# Patient Record
Sex: Female | Born: 1955 | Race: Black or African American | Hispanic: No | Marital: Single | Smoking: Current every day smoker
Health system: Southern US, Community
[De-identification: ages and names within clinical notes are randomized; demographics above are authoritative.]

## PROBLEM LIST (undated history)

## (undated) DIAGNOSIS — D66 Hereditary factor VIII deficiency: Secondary | ICD-10-CM

## (undated) DIAGNOSIS — I82409 Acute embolism and thrombosis of unspecified deep veins of unspecified lower extremity: Secondary | ICD-10-CM

## (undated) DIAGNOSIS — I10 Essential (primary) hypertension: Secondary | ICD-10-CM

## (undated) DIAGNOSIS — I2510 Atherosclerotic heart disease of native coronary artery without angina pectoris: Secondary | ICD-10-CM

## (undated) DIAGNOSIS — E1169 Type 2 diabetes mellitus with other specified complication: Secondary | ICD-10-CM

## (undated) DIAGNOSIS — E669 Obesity, unspecified: Secondary | ICD-10-CM

## (undated) DIAGNOSIS — I509 Heart failure, unspecified: Secondary | ICD-10-CM

## (undated) HISTORY — PX: CHOLECYSTECTOMY: SHX55

---

## 2015-10-11 ENCOUNTER — Encounter (HOSPITAL_COMMUNITY): Payer: Self-pay | Admitting: Internal Medicine

## 2015-10-11 ENCOUNTER — Emergency Department (HOSPITAL_COMMUNITY): Payer: Medicare Other

## 2015-10-11 ENCOUNTER — Emergency Department (HOSPITAL_COMMUNITY)
Admission: EM | Admit: 2015-10-11 | Discharge: 2015-10-28 | Disposition: E | Payer: Medicare Other | Attending: Emergency Medicine | Admitting: Emergency Medicine

## 2015-10-11 DIAGNOSIS — E876 Hypokalemia: Secondary | ICD-10-CM | POA: Diagnosis not present

## 2015-10-11 DIAGNOSIS — Z880 Allergy status to penicillin: Secondary | ICD-10-CM | POA: Diagnosis not present

## 2015-10-11 DIAGNOSIS — D709 Neutropenia, unspecified: Secondary | ICD-10-CM | POA: Diagnosis not present

## 2015-10-11 DIAGNOSIS — I2510 Atherosclerotic heart disease of native coronary artery without angina pectoris: Secondary | ICD-10-CM | POA: Insufficient documentation

## 2015-10-11 DIAGNOSIS — M7989 Other specified soft tissue disorders: Secondary | ICD-10-CM | POA: Insufficient documentation

## 2015-10-11 DIAGNOSIS — R6520 Severe sepsis without septic shock: Secondary | ICD-10-CM | POA: Insufficient documentation

## 2015-10-11 DIAGNOSIS — R4182 Altered mental status, unspecified: Secondary | ICD-10-CM | POA: Diagnosis present

## 2015-10-11 DIAGNOSIS — E119 Type 2 diabetes mellitus without complications: Secondary | ICD-10-CM | POA: Diagnosis not present

## 2015-10-11 DIAGNOSIS — Z7952 Long term (current) use of systemic steroids: Secondary | ICD-10-CM | POA: Diagnosis not present

## 2015-10-11 DIAGNOSIS — Z79899 Other long term (current) drug therapy: Secondary | ICD-10-CM | POA: Insufficient documentation

## 2015-10-11 DIAGNOSIS — F17200 Nicotine dependence, unspecified, uncomplicated: Secondary | ICD-10-CM | POA: Diagnosis not present

## 2015-10-11 DIAGNOSIS — A419 Sepsis, unspecified organism: Secondary | ICD-10-CM | POA: Diagnosis not present

## 2015-10-11 DIAGNOSIS — N179 Acute kidney failure, unspecified: Secondary | ICD-10-CM | POA: Diagnosis not present

## 2015-10-11 DIAGNOSIS — R000 Tachycardia, unspecified: Secondary | ICD-10-CM | POA: Insufficient documentation

## 2015-10-11 DIAGNOSIS — I10 Essential (primary) hypertension: Secondary | ICD-10-CM | POA: Diagnosis not present

## 2015-10-11 DIAGNOSIS — R197 Diarrhea, unspecified: Secondary | ICD-10-CM | POA: Diagnosis not present

## 2015-10-11 DIAGNOSIS — E669 Obesity, unspecified: Secondary | ICD-10-CM | POA: Insufficient documentation

## 2015-10-11 DIAGNOSIS — Z794 Long term (current) use of insulin: Secondary | ICD-10-CM | POA: Diagnosis not present

## 2015-10-11 DIAGNOSIS — R109 Unspecified abdominal pain: Secondary | ICD-10-CM | POA: Insufficient documentation

## 2015-10-11 DIAGNOSIS — I959 Hypotension, unspecified: Secondary | ICD-10-CM | POA: Insufficient documentation

## 2015-10-11 DIAGNOSIS — E872 Acidosis, unspecified: Secondary | ICD-10-CM

## 2015-10-11 DIAGNOSIS — R5081 Fever presenting with conditions classified elsewhere: Secondary | ICD-10-CM

## 2015-10-11 DIAGNOSIS — R0682 Tachypnea, not elsewhere classified: Secondary | ICD-10-CM | POA: Insufficient documentation

## 2015-10-11 HISTORY — DX: Acute embolism and thrombosis of unspecified deep veins of unspecified lower extremity: I82.409

## 2015-10-11 HISTORY — DX: Type 2 diabetes mellitus with other specified complication: E11.69

## 2015-10-11 HISTORY — DX: Hereditary factor VIII deficiency: D66

## 2015-10-11 HISTORY — DX: Atherosclerotic heart disease of native coronary artery without angina pectoris: I25.10

## 2015-10-11 HISTORY — DX: Essential (primary) hypertension: I10

## 2015-10-11 HISTORY — DX: Heart failure, unspecified: I50.9

## 2015-10-11 HISTORY — DX: Obesity, unspecified: E66.9

## 2015-10-11 LAB — CBC WITH DIFFERENTIAL/PLATELET
BASOS PCT: 3 %
Band Neutrophils: 0 %
Basophils Absolute: 0 10*3/uL (ref 0.0–0.1)
Blasts: 0 %
EOS ABS: 0 10*3/uL (ref 0.0–0.7)
EOS PCT: 7 %
HCT: 32.1 % — ABNORMAL LOW (ref 36.0–46.0)
Hemoglobin: 10.2 g/dL — ABNORMAL LOW (ref 12.0–15.0)
LYMPHS PCT: 35 %
Lymphs Abs: 0.1 10*3/uL — ABNORMAL LOW (ref 0.7–4.0)
MCH: 27.1 pg (ref 26.0–34.0)
MCHC: 31.8 g/dL (ref 30.0–36.0)
MCV: 85.4 fL (ref 78.0–100.0)
MONO ABS: 0.1 10*3/uL (ref 0.1–1.0)
Metamyelocytes Relative: 0 %
Monocytes Relative: 24 %
Myelocytes: 0 %
NEUTROS ABS: 0.1 10*3/uL — AB (ref 1.7–7.7)
NEUTROS PCT: 31 %
NRBC: 0 /100{WBCs}
PLATELETS: 201 10*3/uL (ref 150–400)
Promyelocytes Absolute: 0 %
RBC: 3.76 MIL/uL — ABNORMAL LOW (ref 3.87–5.11)
RDW: 14.9 % (ref 11.5–15.5)
WBC: 0.3 10*3/uL — CL (ref 4.0–10.5)

## 2015-10-11 LAB — URINALYSIS, ROUTINE W REFLEX MICROSCOPIC
GLUCOSE, UA: 100 mg/dL — AB
KETONES UR: 15 mg/dL — AB
NITRITE: NEGATIVE
Protein, ur: >300 mg/dL — AB
Specific Gravity, Urine: 1.034 — ABNORMAL HIGH (ref 1.005–1.030)
pH: 5.5 (ref 5.0–8.0)

## 2015-10-11 LAB — COMPREHENSIVE METABOLIC PANEL
ALT: 12 U/L — AB (ref 14–54)
ANION GAP: 14 (ref 5–15)
AST: 24 U/L (ref 15–41)
Alkaline Phosphatase: 64 U/L (ref 38–126)
BUN: 18 mg/dL (ref 6–20)
CHLORIDE: 105 mmol/L (ref 101–111)
CO2: 23 mmol/L (ref 22–32)
CREATININE: 1.15 mg/dL — AB (ref 0.44–1.00)
Calcium: 7.3 mg/dL — ABNORMAL LOW (ref 8.9–10.3)
GFR calc non Af Amer: 51 mL/min — ABNORMAL LOW (ref >60)
GFR, EST AFRICAN AMERICAN: 60 mL/min — AB (ref >60)
GLUCOSE: 140 mg/dL — AB (ref 65–99)
Potassium: 2.4 mmol/L — CL (ref 3.5–5.1)
SODIUM: 142 mmol/L (ref 135–145)
Total Bilirubin: 0.4 mg/dL (ref 0.3–1.2)
Total Protein: 3.1 g/dL — ABNORMAL LOW (ref 6.5–8.1)

## 2015-10-11 LAB — LIPASE, BLOOD: Lipase: 14 U/L (ref 11–51)

## 2015-10-11 LAB — BRAIN NATRIURETIC PEPTIDE: B Natriuretic Peptide: 633.1 pg/mL — ABNORMAL HIGH (ref 0.0–100.0)

## 2015-10-11 LAB — URINE MICROSCOPIC-ADD ON

## 2015-10-11 LAB — I-STAT CG4 LACTIC ACID, ED
LACTIC ACID, VENOUS: 6.96 mmol/L — AB (ref 0.5–2.0)
Lactic Acid, Venous: 8.4 mmol/L (ref 0.5–2.0)

## 2015-10-11 LAB — I-STAT TROPONIN, ED: Troponin i, poc: 0.11 ng/mL (ref 0.00–0.08)

## 2015-10-11 LAB — RAPID HIV SCREEN (HIV 1/2 AB+AG)
HIV 1/2 Antibodies: NONREACTIVE
HIV-1 P24 Antigen - HIV24: NONREACTIVE

## 2015-10-11 MED ORDER — IOPAMIDOL (ISOVUE-300) INJECTION 61%
INTRAVENOUS | Status: AC
  Administered 2015-10-11: 100 mL
  Filled 2015-10-11: qty 100

## 2015-10-11 MED ORDER — SODIUM CHLORIDE 0.9 % IV BOLUS (SEPSIS)
1000.0000 mL | Freq: Once | INTRAVENOUS | Status: AC
  Administered 2015-10-11: 1000 mL via INTRAVENOUS

## 2015-10-11 MED ORDER — POTASSIUM CHLORIDE 10 MEQ/100ML IV SOLN
10.0000 meq | INTRAVENOUS | Status: AC
  Administered 2015-10-11 (×2): 10 meq via INTRAVENOUS
  Filled 2015-10-11 (×2): qty 100

## 2015-10-11 MED ORDER — FENTANYL CITRATE (PF) 100 MCG/2ML IJ SOLN
50.0000 ug | Freq: Once | INTRAMUSCULAR | Status: AC
  Administered 2015-10-11: 50 ug via INTRAVENOUS
  Filled 2015-10-11: qty 2

## 2015-10-11 MED ORDER — KETOROLAC TROMETHAMINE 30 MG/ML IJ SOLN
30.0000 mg | Freq: Once | INTRAMUSCULAR | Status: AC
  Administered 2015-10-11: 30 mg via INTRAVENOUS
  Filled 2015-10-11: qty 1

## 2015-10-11 MED ORDER — SODIUM CHLORIDE 0.9 % IV SOLN: 1250.0000 mg | INTRAVENOUS | Status: DC

## 2015-10-11 MED ORDER — VANCOMYCIN HCL 10 G IV SOLR
2000.0000 mg | Freq: Once | INTRAVENOUS | Status: AC
  Administered 2015-10-11: 2000 mg via INTRAVENOUS
  Filled 2015-10-11: qty 2000

## 2015-10-11 MED ORDER — CEFEPIME HCL 1 G IJ SOLR
1.0000 g | Freq: Three times a day (TID) | INTRAMUSCULAR | Status: DC
  Filled 2015-10-11 (×2): qty 1

## 2015-10-11 MED ORDER — ACETAMINOPHEN 650 MG RE SUPP
650.0000 mg | Freq: Once | RECTAL | Status: AC
  Administered 2015-10-11: 650 mg via RECTAL
  Filled 2015-10-11: qty 1

## 2015-10-11 MED ORDER — DEXTROSE 5 % IV SOLN
2.0000 g | Freq: Once | INTRAVENOUS | Status: AC
  Administered 2015-10-11: 2 g via INTRAVENOUS
  Filled 2015-10-11: qty 2

## 2015-10-12 LAB — URINE CULTURE: CULTURE: NO GROWTH

## 2015-10-12 LAB — BLOOD CULTURE ID PANEL (REFLEXED)
ACINETOBACTER BAUMANNII: NOT DETECTED
CANDIDA ALBICANS: NOT DETECTED
CANDIDA GLABRATA: NOT DETECTED
CANDIDA PARAPSILOSIS: NOT DETECTED
CANDIDA TROPICALIS: NOT DETECTED
Candida krusei: NOT DETECTED
Carbapenem resistance: NOT DETECTED
ENTEROBACTER CLOACAE COMPLEX: NOT DETECTED
ENTEROCOCCUS SPECIES: NOT DETECTED
ESCHERICHIA COLI: NOT DETECTED
Enterobacteriaceae species: DETECTED — AB
Haemophilus influenzae: NOT DETECTED
Klebsiella oxytoca: NOT DETECTED
Klebsiella pneumoniae: DETECTED — AB
LISTERIA MONOCYTOGENES: NOT DETECTED
Methicillin resistance: NOT DETECTED
Neisseria meningitidis: NOT DETECTED
Proteus species: NOT DETECTED
Pseudomonas aeruginosa: NOT DETECTED
SERRATIA MARCESCENS: NOT DETECTED
STREPTOCOCCUS PNEUMONIAE: NOT DETECTED
STREPTOCOCCUS PYOGENES: NOT DETECTED
Staphylococcus aureus (BCID): NOT DETECTED
Staphylococcus species: NOT DETECTED
Streptococcus agalactiae: NOT DETECTED
Streptococcus species: NOT DETECTED
Vancomycin resistance: NOT DETECTED

## 2015-10-12 LAB — PATHOLOGIST SMEAR REVIEW

## 2015-10-12 LAB — HIV-1 RNA QUANT-NO REFLEX-BLD
HIV 1 RNA Quant: <20 copies/mL
LOG10 HIV-1 RNA: UNDETERMINED {Log_copies}/mL

## 2015-10-14 LAB — CULTURE, BLOOD (ROUTINE X 2)

## 2015-10-28 NOTE — ED Provider Notes (Signed)
CSN: [id number]     Arrival date & time Oct 25, 2015  1153 History   First MD Initiated Contact with Patient 2015-10-25 1210     Chief Complaint  Patient presents with  . Altered Mental Status     (Consider location/radiation/quality/duration/timing/severity/associated sxs/prior Treatment) Patient is a 60 y.o. female presenting with general illness. The history is provided by a relative. The history is limited by the condition of the patient.  Illness Location:  Altered mental status. 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 Several days of watery diarrhea. Altered as of this morning. Patient unable to provide any information; all information gathered from the patient's daughter. Associated symptoms: diarrhea and fever     No past medical history on file. No past surgical history on file. No family history on file. Social History  Substance Use Topics  . Smoking status: Not on file  . Smokeless tobacco: Not on file  . Alcohol Use: Not on file   OB History    No data available     Review of Systems  Unable to perform ROS: Mental status change  Constitutional: Positive for fever and activity change.  Gastrointestinal: Positive for diarrhea.      Allergies  Penicillins and Shellfish allergy  Home Medications   Prior to Admission medications   Medication Sig Start Date End Date Taking? Authorizing Provider  atorvastatin (LIPITOR) 80 MG tablet Take 80 mg by mouth daily.   Yes Historical Provider, MD  furosemide (LASIX) 80 MG tablet Take 80 mg by mouth daily.   Yes Historical Provider, MD  HYDROcodone-acetaminophen (NORCO/VICODIN) 5-325 MG tablet Take 1-2 tablets by mouth every 6 (six) hours as needed for moderate pain.   Yes Historical Provider, MD  insulin aspart (NOVOLOG FLEXPEN) 100 UNIT/ML FlexPen Inject into the skin.   Yes  Historical Provider, MD  lisinopril (PRINIVIL,ZESTRIL) 20 MG tablet Take 20 mg by mouth 2 (two) times daily.   Yes Historical Provider, MD  potassium chloride SA (K-DUR,KLOR-CON) 20 MEQ tablet Take 20 mEq by mouth 2 (two) times daily.   Yes Historical Provider, MD  predniSONE (DELTASONE) 20 MG tablet Take 80 mg by mouth daily with breakfast.   Yes Historical Provider, MD   BP 94/59 mmHg  Pulse 137  Resp 37  SpO2 97% Physical Exam  Constitutional: She is easily aroused. She appears toxic. She appears ill. She appears distressed.  HENT:  Head: Normocephalic and atraumatic.  Eyes: EOM are normal. Pupils are equal, round, and reactive to light.  Neck: Normal range of motion.  Cardiovascular: Regular rhythm.  Tachycardia present.   Pulmonary/Chest: Tachypnea noted. No respiratory distress. She has no wheezes. She has no rhonchi.  Abdominal: Soft. Normal appearance. There is generalized tenderness. There is guarding. There is no rebound, no CVA tenderness, no tenderness at McBurney's point and negative Murphy's sign.  Musculoskeletal:  Bilateral lower extremity swelling. Right lower extremity: Mild erythema and warmth to touch. No induration or fluctuance. Tenderness palpation throughout.  Neurological: She is easily aroused. GCS eye subscore is 4. GCS verbal subscore is 5. GCS motor subscore is 6.  Skin: Skin is warm and dry.    ED Course  Procedures (including critical care time) Labs Review Labs Reviewed  COMPREHENSIVE METABOLIC PANEL - Abnormal; Notable for the following:    Potassium 2.4 (*)    Glucose, Bld 140 (*)  Creatinine, Ser 1.15 (*)    Calcium 7.3 (*)    Total Protein 3.1 (*)    Albumin <1.0 (*)    ALT 12 (*)    GFR calc non Af Amer 51 (*)    GFR calc Af Amer 60 (*)    All other components within normal limits  CBC WITH DIFFERENTIAL/PLATELET - Abnormal; Notable for the following:    WBC 0.3 (*)    RBC 3.76 (*)    Hemoglobin 10.2 (*)    HCT 32.1 (*)    Neutro Abs  0.1 (*)    Lymphs Abs 0.1 (*)    All other components within normal limits  URINALYSIS, ROUTINE W REFLEX MICROSCOPIC (NOT AT [facility]) - Abnormal; Notable for the following:    Color, Urine AMBER (*)    APPearance TURBID (*)    Specific Gravity, Urine 1.034 (*)    Glucose, UA 100 (*)    Hgb urine dipstick MODERATE (*)    Bilirubin Urine SMALL (*)    Ketones, ur 15 (*)    Protein, ur >300 (*)    Leukocytes, UA TRACE (*)    All other components within normal limits  URINE MICROSCOPIC-ADD ON - Abnormal; Notable for the following:    Squamous Epithelial / LPF 6-30 (*)    Bacteria, UA FEW (*)    Casts GRANULAR CAST (*)    All other components within normal limits  BRAIN NATRIURETIC PEPTIDE - Abnormal; Notable for the following:    B Natriuretic Peptide 633.1 (*)    All other components within normal limits  I-STAT CG4 LACTIC ACID, ED - Abnormal; Notable for the following:    Lactic Acid, Venous 8.40 (*)    All other components within normal limits  I-STAT TROPOININ, ED - Abnormal; Notable for the following:    Troponin i, poc 0.11 (*)    All other components within normal limits  CULTURE, BLOOD (ROUTINE X 2)  CULTURE, BLOOD (ROUTINE X 2)  URINE CULTURE  LIPASE, BLOOD  HIV 1 RNA QUANT-NO REFLEX-BLD  RAPID HIV SCREEN (HIV 1/2 AB+AG)  I-STAT CG4 LACTIC ACID, ED    Imaging Review Dg Chest Port 1 View  18-Oct-2015  CLINICAL DATA:  Shortness of breath. EXAM: PORTABLE CHEST 1 VIEW COMPARISON:  None. FINDINGS: Cardiomegaly with mild pulmonary vascular prominence and interstitial prominence, left side greater than right. Small bilateral pleural effusions. These findings suggest congestive heart failure. Associated pneumonitis cannot be excluded . Prior cervical spine fusion. IMPRESSION: Findings consistent with congestive heart failure with pulmonary interstitial edema and small bilateral pleural effusions . Electronically Signed   By: Maisie Fus  Register   On: 2015/10/18 13:29   Dg Tibia/fibula  Right Port  Oct 18, 2015  CLINICAL DATA:  Right lower leg pain with erythema. EXAM: PORTABLE RIGHT TIBIA AND FIBULA - 2 VIEW COMPARISON:  None. FINDINGS: There is no evidence of fracture or other focal bone lesions. No focal soft tissue abnormalities. No evidence of radiopaque foreign body. IMPRESSION: Negative. Electronically Signed   By: Irish Lack M.D.   On: Oct 18, 2015 13:34   Dg Hip Port Unilat With Pelvis 1v Right  10/18/15  CLINICAL DATA:  Right lower extremity pain. EXAM: DG HIP (WITH OR WITHOUT PELVIS) 1V PORT RIGHT COMPARISON:  None. FINDINGS: There is no evidence of hip fracture or dislocation. Mild osteoarthritis present. No bony lesions or destruction. Visualized bony pelvis is unremarkable. IMPRESSION: Mild right hip osteoarthritis. Electronically Signed   By: Irish Lack M.D.   On:  09-27-2015 13:37   Dg Femur Port, Min 2 Views Right  12/09/15  CLINICAL DATA:  Pain EXAM: RIGHT FEMUR PORTABLE 2 VIEW COMPARISON:  None. FINDINGS: Frontal and lateral views were obtained. There is no apparent fracture or dislocation. There is mild narrowing in the medial compartment of the knee joint. No erosive change or abnormal periosteal reaction. IMPRESSION: Narrowing medial aspect knee joint. No fracture or dislocation. No abnormal periosteal reaction. Electronically Signed   By: Bretta BangWilliam  Woodruff III M.D.   On: 09-27-2015 13:39   I have personally reviewed and evaluated these images and lab results as part of my medical decision-making.   EKG Interpretation   Date/Time:  Monday Oct 11 2015 12:06:09 EDT Ventricular Rate:  139 PR Interval:  138 QRS Duration: 67 QT Interval:  243 QTC Calculation: 369 R Axis:   70 Text Interpretation:  Sinus tachycardia Low voltage, extremity and  precordial leads Minimal ST depression, anterolateral leads Confirmed by  [facility] HospitalMESNER MD, Barbara CowerJASON [phone]([id number]) on 12/09/15 3:40:08 PM      MDM   Final diagnoses:  Hypotension  Severe sepsis (HCC)  Neutropenic  fever (HCC)  Sinus tachycardia (HCC)  Lactic acidosis  Hypokalemia  AKI (acute kidney injury) (HCC)    60 year old female with a past medical history factor VIII deficiency presenting with severe sepsis and neutropenic fever. Diarrhea for the last several days. Acutely altered this morning. Brought to the emergency room. On arrival the patient is in distress, toxic appearing. Tachycardic and hypotensive. Febrile to 101.3. Lactic acidosis to greater than 8. Neutropenia with a white count of 0.3, ANC of 0.1. Started neutropenic precautions. Started antibiotics and 30 mL/kg bolus. IVC was initially flat; blood pressure responding well to IV fluids. Some erythema and tenderness palpation of her entire right lower extremity. X-rays didn't show any evidence of fracture. Potential cellulitis as source. Patient also diffusely tender to palpation of her abdomen. Plan is to get a CT abdomen and pelvis; however the patient is not stable enough for that at this time. Chest x-ray without evidence of pneumonia. Intending to endorse pain. Getting IV Toradol and a Tylenol suppository. Hypokalemia. Repleting. AKI.  Since blood pressure is stable at this time and responded well to IV fluids, feel the patient should be appropriate for the stepdown unit. Contacting the medicine team.  Lactate is trending in the correct direction. We'll continue to follow this. Patient is requiring more oxygen. Now on 5 L satting in the high 80s. Family is amenable to BiPAP and pressors; however they do not want intubation or CPR.  We'll admit to the stepdown unit medicine team, Dr. Andrey CampanileWilson.  Lindalou HoseSean O'Rourke, MD 25-Dec-2015 1741  Marily MemosJason Mesner, MD 10/12/15 [phone]

## 2015-10-28 NOTE — ED Notes (Signed)
Pt here from home with c/o ams and right leg pain , pt is alert to self on arrival pain to touch on right leg , which is also red and warm to touch

## 2015-10-28 NOTE — ED Notes (Signed)
Admitting MDs in for pt eval

## 2015-10-28 NOTE — Progress Notes (Addendum)
Pharmacy Antibiotic Note  Emma PilesLeslie Hendricks is a 60 y.o. female admitted on 11/06/15 with sepsis.  Pharmacy has been consulted for vanc/cefepime dosing. Estimated wt in the ED ~200-225lbs per RN. SCr 1.15 on admit, normalized CrCl~60 with estimated weight.  Plan: Vanc 2g IV x1; then 1250mg  IV q24h Cefepime 2g IV x1; then 1g IV q8h Monitor clinical progress, c/s, renal function, abx plan/LOT VT@SS      No data recorded.   Recent Labs Lab 10/27/2015 1355 10/27/2015 1400  WBC PENDING  --   LATICACIDVEN  --  8.40*    CrCl cannot be calculated (Unknown ideal weight.).    Allergies  Allergen Reactions  . Penicillins Other (See Comments)    Unk  . Shellfish Allergy Hives    Antimicrobials this admission: 5/15 vanc >>  5/15 cefepime >>   Dose adjustments this admission:   Microbiology results: 5/15 BCx:  5/15 UCx:     Babs BertinHaley Mathayus Stanbery, PharmD, [facility] CenterBCPS Clinical Pharmacist Pager [phone] 11/06/15 2:32 PM

## 2015-10-28 NOTE — ED Notes (Signed)
[place], RN speaking with admitting MD to make aware of situation.

## 2015-10-28 NOTE — ED Notes (Signed)
Attempted Temp Foley on pt without success. Informed [place]had - Charity fundraiserN.

## 2015-10-28 NOTE — ED Notes (Signed)
Called to CT Scan for assistance with patient. Pt not breathing and no pulse noted. RN and EMT started compressions. EDP called and informed to stop that the patient is DNR/DNI. Pt returned to ED Rm. EDP pronounced time of death at 1755. Admitting MDs paged to come speak to family.

## 2015-10-28 NOTE — Progress Notes (Signed)
   Subjective: Called to admit patient in the ED. Ms. Montez MoritaCarter is a 60 year old female with a past medical history of CHF (congential?), DM type 2, HTN, history of DVTs, CAD, reported factor VIII deficiency who presented with watery diarrhea and altered mental status. Patient's daughter reported that she began complaining of abdominal pain yesterday after several days of watery diarrhea. She also has been complaining of right leg pain. No fevers recently.  In the ED, she was tachycardic, hypotensive and febrile to 101.3. Lactic acidosis >8. Neutropenia with a WBC of 0.3. She was started on broad spectrum abx. BP responded to fluids. Began requiring more Oxygen and was was in the high 80s on 5L.   Discussed code status with family and patient. Indicated that patient would not want intubation or CRP. Would be amenable to BiPAP and pressors if needed.   Objective: General: well-developed chronically ill appearing obese female in mild respiratory distress  Head: normocephalic and atraumatic.  Eyes: vision grossly intact, pupils equal, pupils round, pupils reactive to light, no injection and anicteric.  Mouth: pharynx pink and dry, no erythema, and no exudates.  Neck: supple, full ROM, no thyromegaly, no JVD, and no carotid bruits.  Lungs: tahcypneic, mild respiratory distress, diffuse crackles Heart: tachycardic, regular rhythm, no murmur, no gallop, and no rub.  Abdomen: Soft, diffuse generalized tenderness to palpation, guarding, no rebound, BS+  Pulses: 2+ DP/PT pulses bilaterally Extremities: anasarca with 3+ pitting edema in dependent areas, b/l LE cool to the touch, RLE with mild erythema.  Neurologic: alert & oriented to person, easily arousuable, answering simple questions and following commands, CN II-XII intact, no focal deficits Skin: turgor normal and no rashes.   Assessment/Plan:  Patient was hemodynamically stable when seen in ED. EDP spoke with PCCM and made aware of the patient,  agreed to consult on the patient. She was transferred to CT and upon arrival patient became increasingly agitated pulling NRB off her face. At 1749 patient acting normally. Checked on patient at 1750 and patient noted to not be breathing and unable to obtain a pulse. RN and EMT started chest compressions. CRP performed for less than 20 seconds before informed that patient was DNR/DNI. EDP pronounced time of death at [id number]. Spoke with daughter in the ED afterwards and answered her questions. She did not wish to speak with the pastor at this time. Other family members en route.   Valentino NoseNathan Latreshia Beauchaine, MD IMTS PGY-1 [phone] 03/30/16, 6:33 PM

## 2015-10-28 DEATH — deceased

## 2018-01-12 IMAGING — CT CT HEAD W/O CM
4 series · 18 of 47 positions shown, 20 images · non-contrast
Comparison: None.

CLINICAL DATA: Altered mental status. Sepsis. Difficulty breathing
today.

EXAM:
CT HEAD WITHOUT CONTRAST
TECHNIQUE: Contiguous axial images were obtained from the base of the skull
through the vertex without intravenous contrast.

[Series 201: head w/o, idose (1) · axial · non-contrast · 0.43mm/px · z∈[+103,+218]mm · 8 of 31 slices shown, 10 images]
[im 4/31  brain]
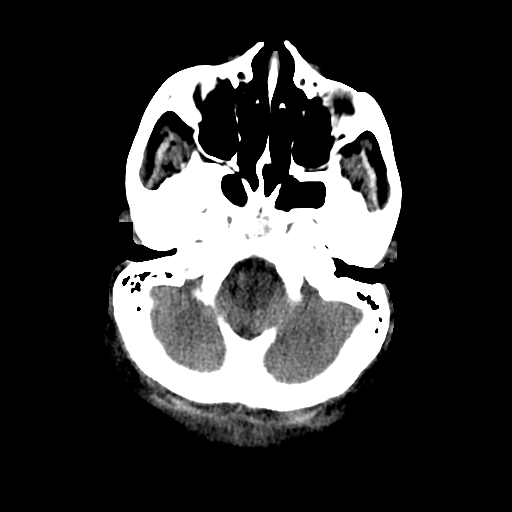
[im 4/31  bone]
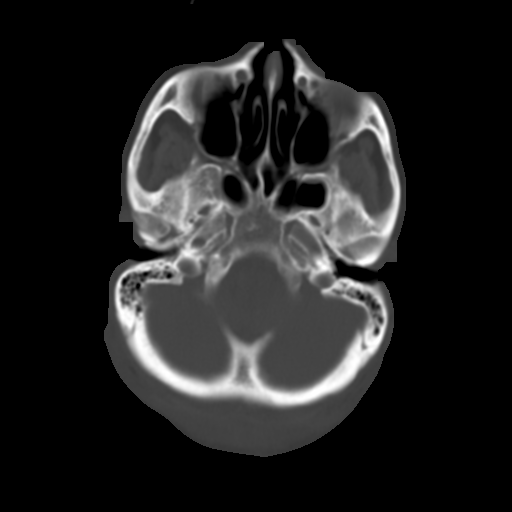
[im 7/31  brain]
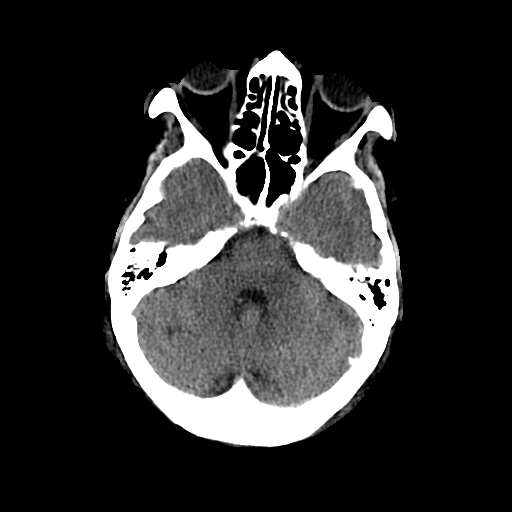
[im 11/31  brain]
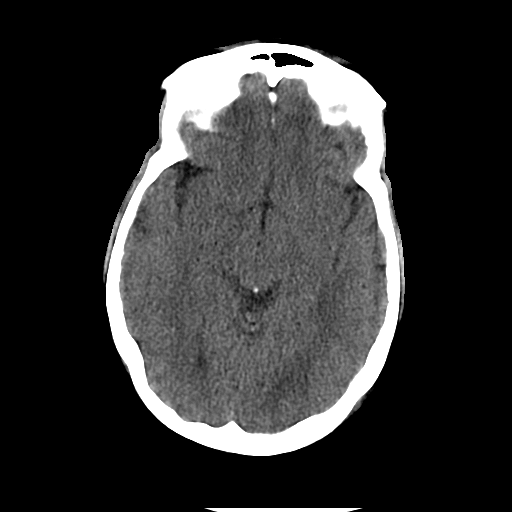
[im 14/31  brain]
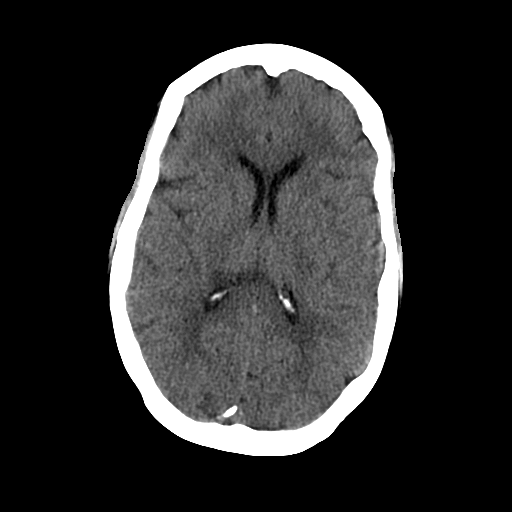
[im 17/31  brain]
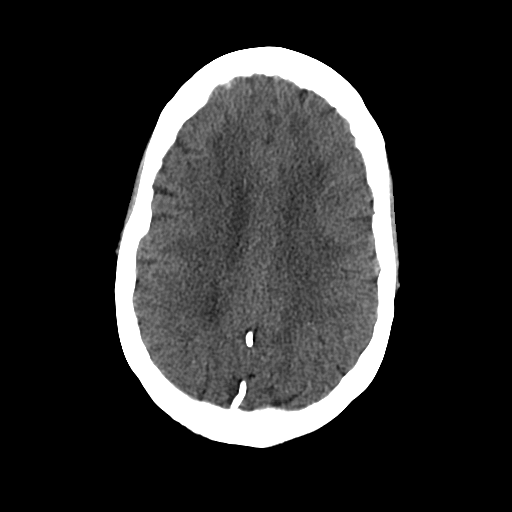
[im 17/31  bone]
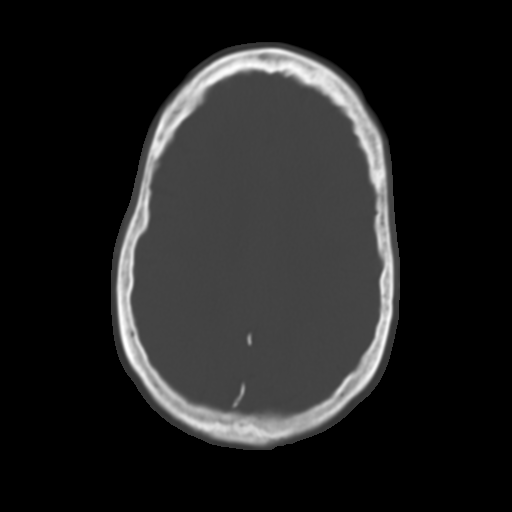
[im 21/31  brain]
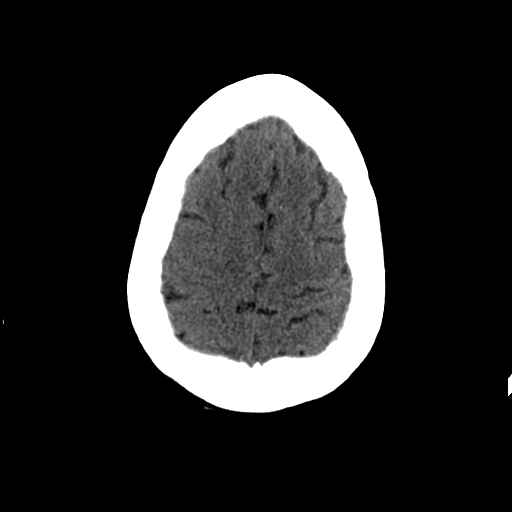
[im 24/31  brain]
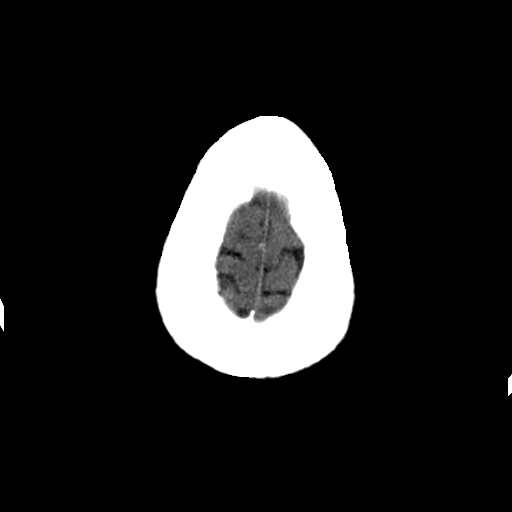
[im 27/31  brain]
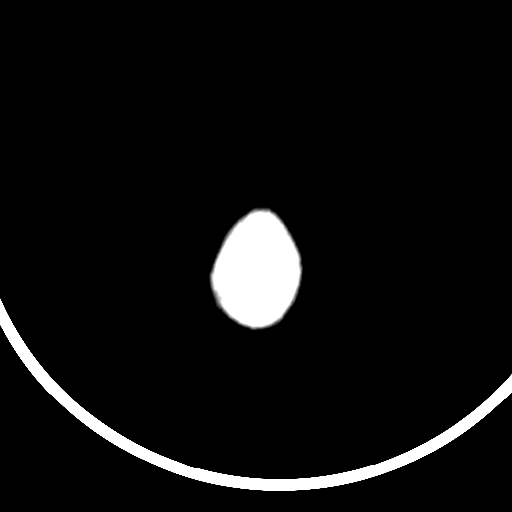

[Series 202: head w/o bone, idose (1) · axial · non-contrast · 0.43mm/px · z∈[+102,+149]mm · 4 of 62 slices shown]
[im 7/62  bone]
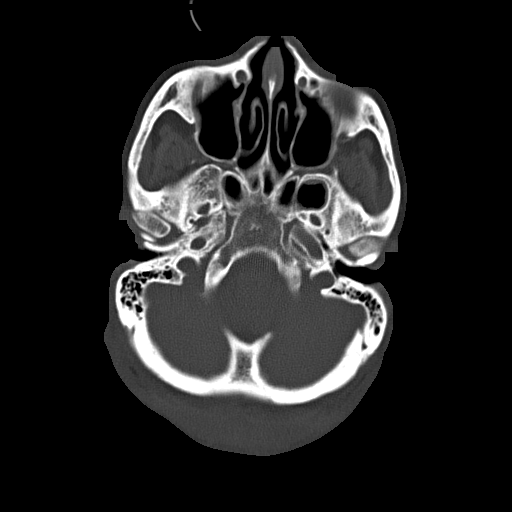
[im 13/62  bone]
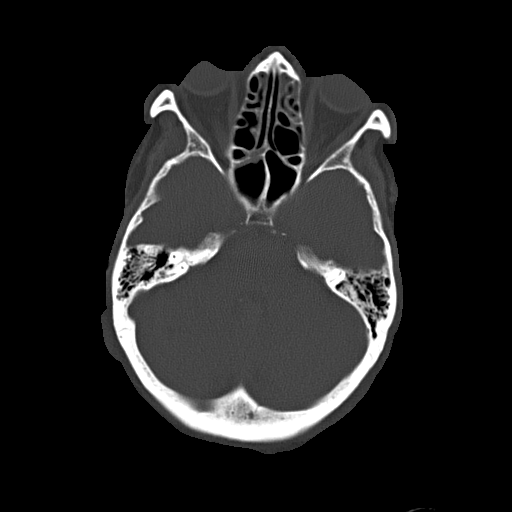
[im 20/62  bone]
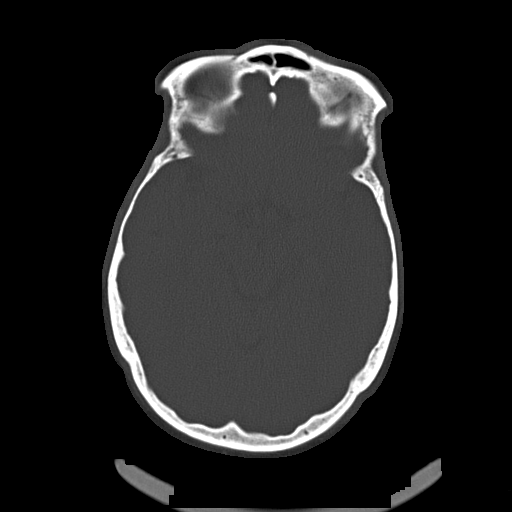
[im 26/62  bone]
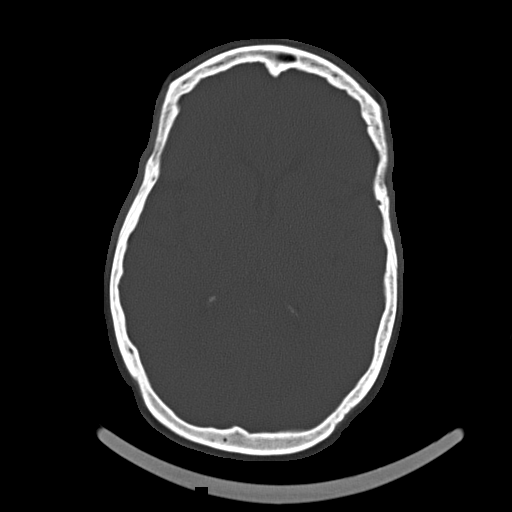

[Series 203: coronal st, idose (1) · coronal · 0.40mm/px · 3 of 69 slices shown]
[im 23/69  brain]
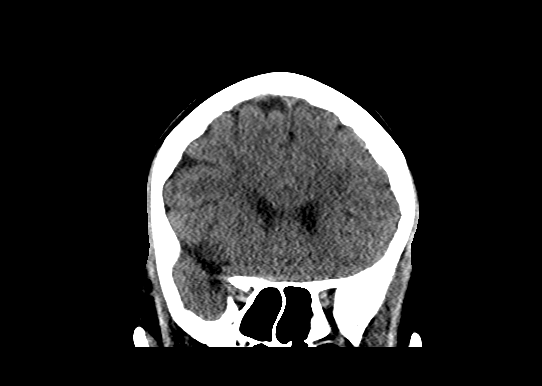
[im 31/69  brain]
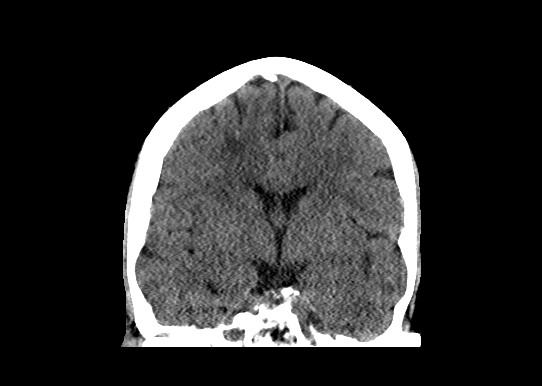
[im 38/69  brain]
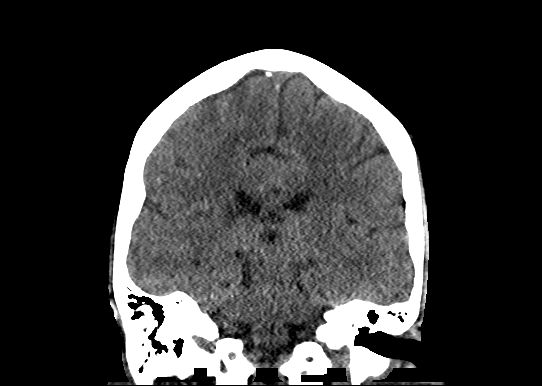

[Series 204: sagittal st, idose (1) · sagittal · 0.40mm/px · 3 of 69 slices shown]
[im 23/69  brain]
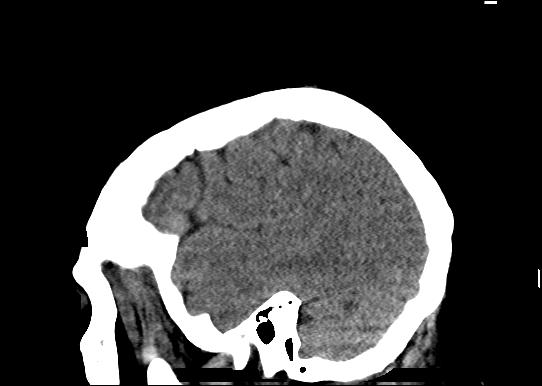
[im 35/69  brain]
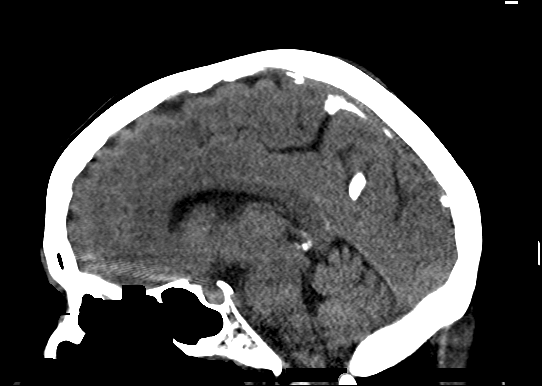
[im 46/69  brain]
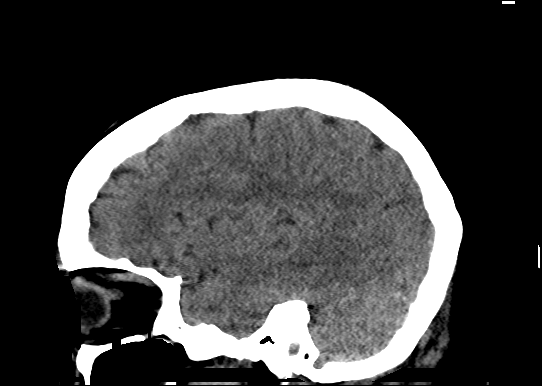

[18 of 47 positions shown; findings below may reference images not displayed]

According to the technologist note, the patient suffered
cardiopulmonary arrest immediately following CT abdomen scan today,
which immediately followed up this head CT. The patient has a do not
resuscitate status and accordingly CPR was discontinued, and death
was pronounced by the on- site practitioner.
FINDINGS: Periventricular white matter and corona radiata hypodensities favor
chronic ischemic microvascular white matter disease.

Otherwise, the brainstem, cerebellum, cerebral peduncles, thalami,
basal ganglia, basilar cisterns, and ventricular system appear
within normal limits. No intracranial hemorrhage, mass lesion, or
acute CVA.

Chronic bilateral maxillary, ethmoid, sphenoid, and frontal
sinusitis.
IMPRESSION: 1. No acute intracranial findings.
2. Chronic paranasal sinusitis.
3. Periventricular white matter and corona radiata hypodensities
favor chronic ischemic microvascular white matter disease.
